# Patient Record
Sex: Male | Born: 1995 | Hispanic: No | Marital: Single | Smoking: Never smoker
Health system: Southern US, Community
[De-identification: ages and names within clinical notes are randomized; demographics above are authoritative.]

## PROBLEM LIST (undated history)

## (undated) DIAGNOSIS — J45909 Unspecified asthma, uncomplicated: Secondary | ICD-10-CM

## (undated) DIAGNOSIS — R109 Unspecified abdominal pain: Secondary | ICD-10-CM

## (undated) HISTORY — DX: Unspecified abdominal pain: R10.9

## (undated) HISTORY — PX: TYMPANOSTOMY TUBE PLACEMENT: SHX32

---
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
## 2017-06-16 DIAGNOSIS — J020 Streptococcal pharyngitis: Secondary | ICD-10-CM | POA: Diagnosis not present

## 2018-04-12 ENCOUNTER — Other Ambulatory Visit: Payer: Self-pay

## 2018-04-12 ENCOUNTER — Encounter: Payer: Self-pay | Admitting: Emergency Medicine

## 2018-04-12 ENCOUNTER — Emergency Department
Admission: EM | Admit: 2018-04-12 | Discharge: 2018-04-12 | Disposition: A | Discharge status: Home / Self Care | Payer: Self-pay | Attending: Emergency Medicine | Admitting: Emergency Medicine

## 2018-04-12 DIAGNOSIS — J45909 Unspecified asthma, uncomplicated: Secondary | ICD-10-CM | POA: Insufficient documentation

## 2018-04-12 DIAGNOSIS — B9789 Other viral agents as the cause of diseases classified elsewhere: Secondary | ICD-10-CM | POA: Insufficient documentation

## 2018-04-12 DIAGNOSIS — J069 Acute upper respiratory infection, unspecified: Secondary | ICD-10-CM | POA: Insufficient documentation

## 2018-04-12 DIAGNOSIS — Z79899 Other long term (current) drug therapy: Secondary | ICD-10-CM | POA: Insufficient documentation

## 2018-04-12 NOTE — Discharge Instructions (Addendum)
Please continue with over-the-counter Tylenol cough and cold medication.  You may also take ibuprofen.  Make sure you are drinking lots of fluids.  If any fevers, worsening cough shortness of breath return to the ER.

## 2018-04-12 NOTE — ED Notes (Signed)
Pt c/o sweats, cough, sore throat. Denies HA, N/V/D. Pt has not checked to see if he has fever. Sore chest d/t cough. Family at bedside.

## 2018-04-12 NOTE — ED Triage Notes (Signed)
Pt to ED via POV c/o cough x 2 days. Pt states that he is able to get up some clear mucus. Pt denies fevers or chills but states that he is having episodes of sweating. Pt denies body aches. Pt is in NAD at this time.

## 2018-04-12 NOTE — ED Provider Notes (Signed)
[facility]   CSN: [id number] Arrival date & time: 04/12/18  1738     History   Chief Complaint Chief Complaint  Patient presents with  . Cough    HPI Kent Torres is a 22 y.o. male presents to the emergency department for evaluation of cough, chills, sore throat.  Symptoms been present for 2 days.  He denies any fevers chest pain or shortness of breath.  No nausea vomiting or diarrhea.  No headaches.  He has not taken any medications except for Tylenol cough and cold with only mild relief of his cough and nasal congestion.  HPI  Past Medical History:  Diagnosis Date  . Abdominal pain, recurrent   . Asthma     Patient Active Problem List   Diagnosis Date Noted  . GE reflux 06/26/2011  . Nausea 06/12/2011  . Obesity (BMI 30-39.9) 06/12/2011  . Periumbilical abdominal pain     Past Surgical History:  Procedure Laterality Date  . TYMPANOSTOMY TUBE PLACEMENT          Home Medications    Prior to Admission medications   Medication Sig Start Date End Date Taking? Authorizing Provider  albuterol-ipratropium (COMBIVENT) 18-103 MCG/ACT inhaler Inhale 2 puffs into the lungs every 6 (six) hours as needed.    [provider]  cetirizine (ZYRTEC) 10 MG tablet Take 10 mg by mouth daily.    [provider]  levalbuterol Pauline Aus(XOPENEX HFA) 45 MCG/ACT inhaler Inhale 1-2 puffs into the lungs every 4 (four) hours as needed.    [provider]  naproxen (NAPROSYN) 500 MG tablet Take 1 tablet (500 mg total) by mouth 2 (two) times daily. 04/19/15   Jolene ProvostPatel, Kirtida, MD    Family History Family History  Problem Relation Age of Onset  . Ulcers Father   . Cholelithiasis Maternal Grandmother   . Cholelithiasis Paternal Grandmother     Social History Social History   Tobacco Use  . Smoking status: Never Smoker  . Smokeless tobacco: Never Used  Substance Use Topics  . Alcohol use: No  . Drug use: No       Allergies   Patient has no known allergies.   Review of Systems Review of Systems  Constitutional: Positive for chills. Negative for fever.  HENT: Positive for congestion, rhinorrhea and sore throat. Negative for sinus pressure and sinus pain.   Respiratory: Positive for cough. Negative for wheezing and stridor.   Gastrointestinal: Negative for diarrhea, nausea and vomiting.  Musculoskeletal: Positive for myalgias. Negative for arthralgias and neck stiffness.  Skin: Negative for rash.  Neurological: Negative for dizziness.     Physical Exam Updated Vital Signs BP 132/80   Pulse 83   Temp 98.8 F (37.1 C) (Oral)   Resp 16   Ht 6\' 1"  (1.854 m)   Wt 132.9 kg   SpO2 99%   BMI 38.66 kg/m   Physical Exam Constitutional:      General: He is not in acute distress.    Appearance: He is well-developed.  HENT:     Head: Normocephalic and atraumatic.     Jaw: No trismus.     Right Ear: Hearing, tympanic membrane, ear canal and external ear normal.     Left Ear: Hearing, tympanic membrane, ear canal and external ear normal.     Nose: Rhinorrhea present.     Right Sinus: No maxillary sinus tenderness or frontal sinus tenderness.     Left Sinus: No maxillary sinus  tenderness or frontal sinus tenderness.     Mouth/Throat:     Pharynx: No oropharyngeal exudate, posterior oropharyngeal erythema or uvula swelling.     Tonsils: No tonsillar abscesses.  Eyes:     Conjunctiva/sclera: Conjunctivae normal.  Neck:     Musculoskeletal: Normal range of motion.  Cardiovascular:     Rate and Rhythm: Normal rate and regular rhythm.  Pulmonary:     Effort: Pulmonary effort is normal. No respiratory distress.     Breath sounds: Normal breath sounds. No stridor. No wheezing.  Chest:     Chest wall: No tenderness.  Abdominal:     General: There is no distension.     Palpations: Abdomen is soft.     Tenderness: There is no abdominal tenderness. There is no guarding.  Musculoskeletal:  Normal range of motion.  Skin:    General: Skin is warm and dry.     Findings: No rash.  Neurological:     Mental Status: He is alert and oriented to person, place, and time.  Psychiatric:        Behavior: Behavior normal.        Thought Content: Thought content normal.        Judgment: Judgment normal.      ED Treatments / Results  Labs (all labs ordered are listed, but only abnormal results are displayed) Labs Reviewed - No data to display  EKG None  Radiology No results found.  Procedures Procedures (including critical care time)  Medications Ordered in ED Medications - No data to display   Initial Impression / Assessment and Plan / ED Course  I have reviewed the triage vital signs and the nursing notes.  Pertinent labs & imaging results that were available during my care of the patient were reviewed by me and considered in my medical decision making (see chart for details).     22 year old male with upper respiratory infection.  Vital signs are stable.  Lung sounds are normal.  No concern for strep.  Patient will continue with symptomatic relief with over-the-counter medications he understands signs symptoms return to the ED for.   Final Clinical Impressions(s) / ED Diagnoses   Final diagnoses:  Viral URI with cough    ED Discharge Orders    None       Ronnette JuniperGaines, Thomas C, PA-C 04/12/18 Vincente [id number]    Goodman, Graydon, MD 04/12/18 [phone]

## 2018-09-11 ENCOUNTER — Encounter: Payer: Self-pay | Admitting: Emergency Medicine

## 2018-09-11 ENCOUNTER — Other Ambulatory Visit: Payer: Self-pay

## 2018-09-11 ENCOUNTER — Emergency Department: Payer: HRSA Program

## 2018-09-11 ENCOUNTER — Emergency Department
Admission: EM | Admit: 2018-09-11 | Discharge: 2018-09-11 | Disposition: A | Discharge status: Home / Self Care | Payer: HRSA Program | Attending: Emergency Medicine | Admitting: Emergency Medicine

## 2018-09-11 DIAGNOSIS — J45909 Unspecified asthma, uncomplicated: Secondary | ICD-10-CM | POA: Diagnosis not present

## 2018-09-11 DIAGNOSIS — U071 COVID-19: Secondary | ICD-10-CM | POA: Diagnosis not present

## 2018-09-11 DIAGNOSIS — R509 Fever, unspecified: Secondary | ICD-10-CM | POA: Diagnosis present

## 2018-09-11 DIAGNOSIS — Z79899 Other long term (current) drug therapy: Secondary | ICD-10-CM | POA: Insufficient documentation

## 2018-09-11 LAB — COMPREHENSIVE METABOLIC PANEL
ALT: 21 U/L (ref 0–44)
AST: 18 U/L (ref 15–41)
Albumin: 4.2 g/dL (ref 3.5–5.0)
Alkaline Phosphatase: 42 U/L (ref 38–126)
Anion gap: 11 (ref 5–15)
BUN: 11 mg/dL (ref 6–20)
CO2: 21 mmol/L — ABNORMAL LOW (ref 22–32)
Calcium: 8.9 mg/dL (ref 8.9–10.3)
Chloride: 106 mmol/L (ref 98–111)
Creatinine, Ser: 1.13 mg/dL (ref 0.61–1.24)
GFR calc Af Amer: >60 mL/min (ref >60)
GFR calc non Af Amer: >60 mL/min (ref >60)
Glucose, Bld: 111 mg/dL — ABNORMAL HIGH (ref 70–99)
Potassium: 3.3 mmol/L — ABNORMAL LOW (ref 3.5–5.1)
Sodium: 138 mmol/L (ref 135–145)
Total Bilirubin: 0.6 mg/dL (ref 0.3–1.2)
Total Protein: 7.6 g/dL (ref 6.5–8.1)

## 2018-09-11 LAB — CBC WITH DIFFERENTIAL/PLATELET
Abs Immature Granulocytes: 0.02 10*3/uL (ref 0.00–0.07)
Basophils Absolute: 0 10*3/uL (ref 0.0–0.1)
Basophils Relative: 0 %
Eosinophils Absolute: 0 10*3/uL (ref 0.0–0.5)
Eosinophils Relative: 1 %
HCT: 41.3 % (ref 39.0–52.0)
Hemoglobin: 13.7 g/dL (ref 13.0–17.0)
Immature Granulocytes: 0 %
Lymphocytes Relative: 12 %
Lymphs Abs: 0.6 10*3/uL — ABNORMAL LOW (ref 0.7–4.0)
MCH: 27.4 pg (ref 26.0–34.0)
MCHC: 33.2 g/dL (ref 30.0–36.0)
MCV: 82.6 fL (ref 80.0–100.0)
Monocytes Absolute: 0.8 10*3/uL (ref 0.1–1.0)
Monocytes Relative: 15 %
Neutro Abs: 3.9 10*3/uL (ref 1.7–7.7)
Neutrophils Relative %: 72 %
Platelets: 198 10*3/uL (ref 150–400)
RBC: 5 MIL/uL (ref 4.22–5.81)
RDW: 13 % (ref 11.5–15.5)
WBC: 5.5 10*3/uL (ref 4.0–10.5)
nRBC: 0 % (ref 0.0–0.2)

## 2018-09-11 LAB — SARS CORONAVIRUS 2 BY RT PCR (HOSPITAL ORDER, PERFORMED IN ~~LOC~~ HOSPITAL LAB): SARS Coronavirus 2: POSITIVE — AB

## 2018-09-11 LAB — TROPONIN I: Troponin I: <0.03 ng/mL (ref <0.03)

## 2018-09-11 MED ORDER — KETOROLAC TROMETHAMINE 30 MG/ML IJ SOLN
15.0000 mg | Freq: Once | INTRAMUSCULAR | Status: AC
  Administered 2018-09-11: 15 mg via INTRAVENOUS
  Filled 2018-09-11: qty 1

## 2018-09-11 MED ORDER — ACETAMINOPHEN 500 MG PO TABS
1000.0000 mg | ORAL_TABLET | Freq: Once | ORAL | Status: AC
  Administered 2018-09-11: 12:00:00 1000 mg via ORAL
  Filled 2018-09-11: qty 2

## 2018-09-11 MED ORDER — SODIUM CHLORIDE 0.9 % IV BOLUS
1000.0000 mL | Freq: Once | INTRAVENOUS | Status: AC
Start: 2018-09-11 — End: 2018-09-11
  Administered 2018-09-11: 12:00:00 1000 mL via INTRAVENOUS

## 2018-09-11 NOTE — ED Triage Notes (Signed)
Patient presents to the ED with fever, body aches and chills.  Patient is ambulatory to the ED.  No obvious distress at this time.

## 2018-09-11 NOTE — ED Notes (Signed)
Pt ambulatory in room, no SHOB reported, O2 sats remain 98-99%

## 2018-09-11 NOTE — ED Provider Notes (Signed)
[facility] Emergency Department Provider Note  ____________________________________________  Time seen: Approximately 1:17 PM  I have reviewed the triage vital signs and the nursing notes.   HISTORY  Chief Complaint Fever   HPI Kent Torres is a 23 y.o. male no significant past medical history who presents for evaluation of fever and body aches.  Patient reports that his symptoms started yesterday with fever, body aches, chills, and a mild cough.  He felt better at bedtime but this morning symptoms recurred.  No known exposures to COVID.  Patient does work in a factory.  He denies sore throat, vomiting, diarrhea, chest pain, shortness of breath, abdominal pain.  His symptoms are constant and moderate in intensity.  Past Medical History:  Diagnosis Date   Abdominal pain, recurrent    Asthma     Patient Active Problem List   Diagnosis Date Noted   GE reflux 06/26/2011   Nausea 06/12/2011   Obesity (BMI 30-39.9) 06/12/2011   Periumbilical abdominal pain     Past Surgical History:  Procedure Laterality Date   TYMPANOSTOMY TUBE PLACEMENT      Prior to Admission medications   Medication Sig Start Date End Date Taking? Authorizing Provider  albuterol-ipratropium (COMBIVENT) 18-103 MCG/ACT inhaler Inhale 2 puffs into the lungs every 6 (six) hours as needed.    [provider]  cetirizine (ZYRTEC) 10 MG tablet Take 10 mg by mouth daily.    [provider]  levalbuterol Pauline Aus(XOPENEX HFA) 45 MCG/ACT inhaler Inhale 1-2 puffs into the lungs every 4 (four) hours as needed.    [provider]  naproxen (NAPROSYN) 500 MG tablet Take 1 tablet (500 mg total) by mouth 2 (two) times daily. 04/19/15   Jolene ProvostPatel, Kirtida, MD    Allergies Patient has no known allergies.  Family History  Problem Relation Age of Onset   Ulcers Father    Cholelithiasis Maternal Grandmother    Cholelithiasis Paternal Grandmother     Social  History Social History   Tobacco Use   Smoking status: Never Smoker   Smokeless tobacco: Never Used  Substance Use Topics   Alcohol use: No   Drug use: No    Review of Systems  Constitutional: + fever, chills, body aches Eyes: Negative for visual changes. ENT: Negative for sore throat. Neck: No neck pain  Cardiovascular: Negative for chest pain. Respiratory: Negative for shortness of breath. + cough Gastrointestinal: Negative for abdominal pain, vomiting or diarrhea. Genitourinary: Negative for dysuria. Musculoskeletal: Negative for back pain. Skin: Negative for rash. Neurological: Negative for headaches, weakness or numbness. Psych: No SI or HI  ____________________________________________   PHYSICAL EXAM:  VITAL SIGNS: ED Triage Vitals [09/11/18 1109]  Enc Vitals Group     BP 132/71     Pulse Rate (!) 112     Resp      Temp (!) 103 F (39.4 C)     Temp Source Oral     SpO2 94 %     Weight 294 lb (133.4 kg)     Height 6\' 1"  (1.854 m)     Head Circumference      Peak Flow      Pain Score 0     Pain Loc      Pain Edu?      Excl. in GC?     Constitutional: Alert and oriented. Well appearing and in no apparent distress. HEENT:      Head: Normocephalic and atraumatic.  Eyes: Conjunctivae are normal. Sclera is non-icteric.       Mouth/Throat: Mucous membranes are moist.       Neck: Supple with no signs of meningismus. Cardiovascular: Tachycardic with regular rhythm. No murmurs, gallops, or rubs. 2+ symmetrical distal pulses are present in all extremities. No JVD. Respiratory: Normal respiratory effort. Lungs are clear to auscultation bilaterally. No wheezes, crackles, or rhonchi.  Gastrointestinal: Soft, non tender, and non distended with positive bowel sounds. No rebound or guarding. Musculoskeletal: Nontender with normal range of motion in all extremities. No edema, cyanosis, or erythema of extremities. Neurologic: Normal speech and language. Face  is symmetric. Moving all extremities. No gross focal neurologic deficits are appreciated. Skin: Skin is warm, dry and intact. No rash noted. Psychiatric: Mood and affect are normal. Speech and behavior are normal.  ____________________________________________   LABS (all labs ordered are listed, but only abnormal results are displayed)  Labs Reviewed  SARS CORONAVIRUS 2 (HOSPITAL ORDER, PERFORMED IN [facility]) - Abnormal; Notable for the following components:      Result Value   SARS Coronavirus 2 POSITIVE (*)    All other components within normal limits  CBC WITH DIFFERENTIAL/PLATELET - Abnormal; Notable for the following components:   Lymphs Abs 0.6 (*)    All other components within normal limits  COMPREHENSIVE METABOLIC PANEL - Abnormal; Notable for the following components:   Potassium 3.3 (*)    CO2 21 (*)    Glucose, Bld 111 (*)    All other components within normal limits  TROPONIN I   ____________________________________________  EKG  ED ECG REPORT I, Nita Sickle, the attending physician, personally viewed and interpreted this ECG.  Sinus tachycardia, rate of 115, normal intervals, normal axis, no ST elevations or depressions, diffuse T wave flattening.  No prior for comparison. ____________________________________________  RADIOLOGY  I have personally reviewed the images performed during this visit and I agree with the Radiologist's read.   Interpretation by Radiologist:  Dg Chest Portable 1 View  Result Date: 09/11/2018 CLINICAL DATA:  Fever, shortness of breath EXAM: PORTABLE CHEST 1 VIEW COMPARISON:  04/19/2015 FINDINGS: The heart size and mediastinal contours are within normal limits. Both lungs are clear. The visualized skeletal structures are unremarkable. IMPRESSION: No active disease. Electronically Signed   By: Elige Ko   On: 09/11/2018 11:28     ____________________________________________   PROCEDURES  Procedure(s)  performed: None Procedures Critical Care performed:  None ____________________________________________   INITIAL IMPRESSION / ASSESSMENT AND PLAN / ED COURSE  23 y.o. male no significant past medical history who presents for evaluation of fever, body aches, cough, chills since last night. Ddx COVID, PNA, viral illness.  Patient is febrile of 103F and tachycardic to 112.  Normal work of breathing, normal sats, lungs are clear to auscultation.  Chest x-ray negative for pneumonia.  Labs showing normal white count.  COVID swab is positive. EKG and troponin with no evidence of cardiac injury.  Patient has normal work of breathing, normal sats, clear lungs otherwise, normal sats with ambulation.  No indication for admission at this time.  Discussed very strict follow-up and return precautions.  Recommended Tylenol for fever.  Recommended buying a pulse oximeter and checking at least 3 times at home.  Discussed return precautions for chest pain, shortness of breath or hypoxia.  Discussed quarantine with patient.      As part of my medical decision making, I reviewed the following data within the electronic MEDICAL RECORD NUMBER Nursing  notes reviewed and incorporated, Labs reviewed , EKG interpreted , Old EKG reviewed, Old chart reviewed, Radiograph reviewed , Notes from prior ED visits and  Controlled Substance Database    Pertinent labs & imaging results that were available during my care of the patient were reviewed by me and considered in my medical decision making (see chart for details).    ____________________________________________   FINAL CLINICAL IMPRESSION(S) / ED DIAGNOSES  Final diagnoses:  COVID-19 virus infection      NEW MEDICATIONS STARTED DURING THIS VISIT:  ED Discharge Orders    None       Note:  This document was prepared using Dragon voice recognition software and may include unintentional dictation errors.    Don Perking, [place], MD 09/11/18 [phone]

## 2018-11-04 DIAGNOSIS — S90821A Blister (nonthermal), right foot, initial encounter: Secondary | ICD-10-CM | POA: Diagnosis not present

## 2018-12-08 ENCOUNTER — Ambulatory Visit: Payer: Self-pay | Admitting: Physician Assistant

## 2019-04-22 DIAGNOSIS — J209 Acute bronchitis, unspecified: Secondary | ICD-10-CM | POA: Diagnosis not present

## 2020-02-26 DIAGNOSIS — Z20822 Contact with and (suspected) exposure to covid-19: Secondary | ICD-10-CM | POA: Diagnosis not present

## 2020-02-26 DIAGNOSIS — J069 Acute upper respiratory infection, unspecified: Secondary | ICD-10-CM | POA: Diagnosis not present

## 2020-02-26 DIAGNOSIS — J209 Acute bronchitis, unspecified: Secondary | ICD-10-CM | POA: Diagnosis not present

## 2021-12-10 ENCOUNTER — Other Ambulatory Visit: Payer: Self-pay

## 2021-12-10 ENCOUNTER — Emergency Department
Admission: EM | Admit: 2021-12-10 | Discharge: 2021-12-11 | Disposition: A | Discharge status: Home / Self Care | Payer: Medicaid Other | Attending: Emergency Medicine | Admitting: Emergency Medicine

## 2021-12-10 ENCOUNTER — Emergency Department: Payer: Medicaid Other

## 2021-12-10 DIAGNOSIS — W458XXA Other foreign body or object entering through skin, initial encounter: Secondary | ICD-10-CM | POA: Diagnosis not present

## 2021-12-10 DIAGNOSIS — S60454A Superficial foreign body of right ring finger, initial encounter: Secondary | ICD-10-CM | POA: Insufficient documentation

## 2021-12-10 DIAGNOSIS — M795 Residual foreign body in soft tissue: Secondary | ICD-10-CM

## 2021-12-10 MED ORDER — LIDOCAINE HCL (PF) 1 % IJ SOLN
10.0000 mL | Freq: Once | INTRAMUSCULAR | Status: AC
  Administered 2021-12-10: 10 mL
  Filled 2021-12-10: qty 10

## 2021-12-10 NOTE — ED Triage Notes (Signed)
Patient ambulatory to triage with steady gait, without difficulty or distress noted; pt with tail end of comb punturing thru right 4th & 5th digit

## 2021-12-11 MED ORDER — CEPHALEXIN 500 MG PO CAPS: 500.0000 mg | ORAL_CAPSULE | Freq: Three times a day (TID) | ORAL | 0 refills | Status: AC

## 2021-12-11 MED ORDER — CEPHALEXIN 500 MG PO CAPS
500.0000 mg | ORAL_CAPSULE | Freq: Once | ORAL | Status: AC
  Administered 2021-12-11: 500 mg via ORAL
  Filled 2021-12-11: qty 1

## 2021-12-11 NOTE — ED Provider Notes (Signed)
[facility] Provider Note    Event Date/Time   First MD Initiated Contact with Patient 12/10/21 2341     (approximate)   History   Foreign Body   HPI  Kent Torres is a 26 y.o. male who presents to the ED for evaluation of Foreign Body   Patient presents to the ED after accidentally stabbing himself with the metal tine of a hair comb.  Reports he was taken the braids out of his hair when "I do not know what happened."  Patient is right-hand dominant.  Physical Exam   Triage Vital Signs: ED Triage Vitals  Enc Vitals Group     BP 12/10/21 2327 (!) 156/100     Pulse Rate 12/10/21 2327 87     Resp 12/10/21 2327 18     Temp 12/10/21 2327 98 F (36.7 C)     Temp Source 12/10/21 2327 Oral     SpO2 12/10/21 2327 100 %     Weight 12/10/21 2324 (!) 310 lb (140.6 kg)     Height 12/10/21 2324 6\' 1"  (1.854 m)     Head Circumference --      Peak Flow --      Pain Score 12/10/21 2327 10     Pain Loc --      Pain Edu? --      Excl. in GC? --     Most recent vital signs: Vitals:   12/10/21 2327 12/11/21 0021  BP: (!) 156/100 (!) 149/73  Pulse: 87 79  Resp: 18 19  Temp: 98 F (36.7 C) 98.3 F (36.8 C)  SpO2: 100% 98%    General: Awake, no distress.  Looks systemically well.  Has a metal foreign body projecting through the palmar aspects of his right fourth and fifth fingers as pictured below.  Extends from the middle phalanx of the fifth digit through the DIP of the fourth digit.  Seems to be anterior/palmar to the bone.  No bleeding. CV:  Good peripheral perfusion.  Resp:  Normal effort.  Abd:  No distention.  MSK:  No deformity noted.  Neuro:  No focal deficits appreciated. Other:        ED Results / Procedures / Treatments   Labs (all labs ordered are listed, but only abnormal results are displayed) Labs Reviewed - No data to display  EKG   RADIOLOGY Plain film of the right hand interpreted by me with metallic foreign body through  the fourth and fifth digits without clear signs of bony pathology or fracture  Official radiology report(s): DG Hand Complete Right  Result Date: 12/10/2021 CLINICAL DATA:  Foreign body in the fourth digit related to a comb, initial encounter EXAM: RIGHT HAND - COMPLETE 3+ VIEW COMPARISON:  None Available. FINDINGS: Linear metallic foreign body is noted extending into the soft tissues of the distal fourth digit near the DIP joint. No acute fracture is noted. No bony involvement is identified. No other abnormality is seen. IMPRESSION: Metallic foreign body in the fourth digit consistent with the given clinical history. No bony abnormality is noted. Electronically Signed   By: 12/12/2021 M.D.   On: 12/10/2021 23:44    PROCEDURES and INTERVENTIONS:  .Nerve Block  Date/Time: 12/11/2021 12:08 AM  Performed by: 12/13/2021, MD Authorized by: Delton Prairie, MD   Consent:    Consent obtained:  Verbal   Consent given by:  Patient   Risks, benefits, and alternatives were discussed: yes   Indications:  Indications:  Procedural anesthesia Location:    Body area:  Upper extremity   Laterality:  Right Pre-procedure details:    Skin preparation:  Povidone-iodine   Preparation: Patient was prepped and draped in usual sterile fashion   Skin anesthesia:    Skin anesthesia method:  Local infiltration   Local anesthetic:  Lidocaine 1% w/o epi Post-procedure details:    Dressing:  None   Outcome:  Anesthesia achieved   Procedure completion:  Tolerated well, no immediate complications Comments:     Digital block to the right fourth and fifth fingers performed by landmarks .Foreign Body Removal  Date/Time: 12/11/2021 12:35 AM  Performed by: Delton Prairie, MD Authorized by: Delton Prairie, MD  Risks and benefits: risks, benefits and alternatives were discussed Consent given by: patient Intake: Finger. Anesthesia: digital block Patient restrained: no Patient cooperative: yes Complexity:  simple 1 objects recovered. Objects recovered: Metal Tine from a comb Post-procedure assessment: foreign body removed Patient tolerance: patient tolerated the procedure well with no immediate complications Comments: Well-tolerated, able to pull out metal tine from a comb and 1 smooth motion.  No broken pieces or disruptions noted of this object and I see no indication to repeat the plain film    Medications  lidocaine (PF) (XYLOCAINE) 1 % injection 10 mL (10 mLs Infiltration Given 12/10/21 2359)  cephALEXin (KEFLEX) capsule 500 mg (500 mg Oral Given 12/11/21 0019)     IMPRESSION / MDM / ASSESSMENT AND PLAN / ED COURSE  I reviewed the triage vital signs and the nursing notes.  Differential diagnosis includes, but is not limited to, foreign body, fracture, arterial hemorrhage, tendinous disruption.  Healthy 25yo presents the ED with metal tine from a comb through his fingers.  After digital block, able to easily remove.  Doubt residual foreign body.  Started on Keflex to prevent infection.  Full range of motion after this and able to make a full fist without signs of tendon disruption.  No arterial bleeding.  Hemostatic with direct pressure.  Suitable for outpatient management.  We discussed return precautions.  Clinical Course as of 12/11/21 0036  Tue Dec 11, 2021  0005 Digital block of right fourth and fifth fingers performed.  Well-tolerated. [DS]  0015 FB removed and well tolerated [DS]    Clinical Course User Index [DS] Delton Prairie, MD     FINAL CLINICAL IMPRESSION(S) / ED DIAGNOSES   Final diagnoses:  Foreign body (FB) in soft tissue     Rx / DC Orders   ED Discharge Orders          Ordered    cephALEXin (KEFLEX) 500 MG capsule  3 times daily        12/11/21 0015             Note:  This document was prepared using Dragon voice recognition software and may include unintentional dictation errors.   Delton Prairie, MD 12/11/21 [phone]

## 2021-12-11 NOTE — ED Notes (Signed)
Pt Dc to home. Dc instructions reviewed with all questions answered. Pt verbalizes understanding. Pt ambulatory out of dept with steady gait 

## 2021-12-11 NOTE — Discharge Instructions (Signed)
Please take Tylenol and ibuprofen/Advil for your pain.  It is safe to take them together, or to alternate them every few hours.  Take up to 1000mg  of Tylenol at a time, up to 4 times per day.  Do not take more than 4000 mg of Tylenol in 24 hours.  For ibuprofen, take 400-600 mg, 3 - 4 times per day.  Take the Keflex antibiotic 3 times daily for the next 5 days to help prevent infection.  If you see any signs of infection, swelling, redness and worsening/severe pain that is spreading then please return to the ED.

## 2022-06-08 ENCOUNTER — Emergency Department: Payer: Medicaid Other

## 2022-06-08 ENCOUNTER — Emergency Department
Admission: EM | Admit: 2022-06-08 | Discharge: 2022-06-08 | Disposition: A | Discharge status: Home / Self Care | Payer: Medicaid Other | Attending: Emergency Medicine | Admitting: Emergency Medicine

## 2022-06-08 ENCOUNTER — Other Ambulatory Visit: Payer: Self-pay

## 2022-06-08 DIAGNOSIS — R531 Weakness: Secondary | ICD-10-CM | POA: Diagnosis not present

## 2022-06-08 DIAGNOSIS — R1013 Epigastric pain: Secondary | ICD-10-CM | POA: Diagnosis not present

## 2022-06-08 DIAGNOSIS — R252 Cramp and spasm: Secondary | ICD-10-CM | POA: Insufficient documentation

## 2022-06-08 DIAGNOSIS — R079 Chest pain, unspecified: Secondary | ICD-10-CM | POA: Diagnosis not present

## 2022-06-08 DIAGNOSIS — R0789 Other chest pain: Secondary | ICD-10-CM | POA: Diagnosis not present

## 2022-06-08 LAB — HEPATIC FUNCTION PANEL
ALT: 23 U/L (ref 0–44)
AST: 28 U/L (ref 15–41)
Albumin: 4.2 g/dL (ref 3.5–5.0)
Alkaline Phosphatase: 37 U/L — ABNORMAL LOW (ref 38–126)
Bilirubin, Direct: 0.3 mg/dL — ABNORMAL HIGH (ref 0.0–0.2)
Indirect Bilirubin: 0.7 mg/dL (ref 0.3–0.9)
Total Bilirubin: 1 mg/dL (ref 0.3–1.2)
Total Protein: 7.2 g/dL (ref 6.5–8.1)

## 2022-06-08 LAB — BASIC METABOLIC PANEL
Anion gap: 6 (ref 5–15)
BUN: 17 mg/dL (ref 6–20)
CO2: 21 mmol/L — ABNORMAL LOW (ref 22–32)
Calcium: 8.9 mg/dL (ref 8.9–10.3)
Chloride: 110 mmol/L (ref 98–111)
Creatinine, Ser: 1.02 mg/dL (ref 0.61–1.24)
GFR, Estimated: >60 mL/min (ref >60)
Glucose, Bld: 122 mg/dL — ABNORMAL HIGH (ref 70–99)
Potassium: 4.1 mmol/L (ref 3.5–5.1)
Sodium: 137 mmol/L (ref 135–145)

## 2022-06-08 LAB — CK: Total CK: 367 U/L (ref 49–397)

## 2022-06-08 LAB — CBC
HCT: 42.5 % (ref 39.0–52.0)
Hemoglobin: 14.2 g/dL (ref 13.0–17.0)
MCH: 27.7 pg (ref 26.0–34.0)
MCHC: 33.4 g/dL (ref 30.0–36.0)
MCV: 82.8 fL (ref 80.0–100.0)
Platelets: 270 10*3/uL (ref 150–400)
RBC: 5.13 MIL/uL (ref 4.22–5.81)
RDW: 12.8 % (ref 11.5–15.5)
WBC: 9.3 10*3/uL (ref 4.0–10.5)
nRBC: 0 % (ref 0.0–0.2)

## 2022-06-08 LAB — D-DIMER, QUANTITATIVE: D-Dimer, Quant: <0.27 ug/mL-FEU (ref 0.00–0.50)

## 2022-06-08 LAB — LIPASE, BLOOD: Lipase: 33 U/L (ref 11–51)

## 2022-06-08 LAB — MAGNESIUM: Magnesium: 2.5 mg/dL — ABNORMAL HIGH (ref 1.7–2.4)

## 2022-06-08 LAB — TROPONIN I (HIGH SENSITIVITY): Troponin I (High Sensitivity): <2 ng/L (ref <18)

## 2022-06-08 MED ORDER — FAMOTIDINE 20 MG PO TABS
20.0000 mg | ORAL_TABLET | Freq: Once | ORAL | Status: AC
  Administered 2022-06-08: 20 mg via ORAL
  Filled 2022-06-08: qty 1

## 2022-06-08 MED ORDER — FAMOTIDINE 20 MG PO TABS: 20.0000 mg | ORAL_TABLET | Freq: Every day | ORAL | 11 refills | Status: DC

## 2022-06-08 MED ORDER — ALUM & MAG HYDROXIDE-SIMETH 200-200-20 MG/5ML PO SUSP
30.0000 mL | Freq: Once | ORAL | Status: AC
  Administered 2022-06-08: 30 mL via ORAL
  Filled 2022-06-08: qty 30

## 2022-06-08 MED ORDER — FAMOTIDINE 20 MG PO TABS: 20.0000 mg | ORAL_TABLET | Freq: Every day | ORAL | 11 refills | Status: AC

## 2022-06-08 MED ORDER — LIDOCAINE VISCOUS HCL 2 % MT SOLN
15.0000 mL | Freq: Once | OROMUCOSAL | Status: AC
  Administered 2022-06-08: 15 mL via ORAL
  Filled 2022-06-08: qty 15

## 2022-06-08 NOTE — Discharge Instructions (Signed)
Stay well-hydrated by drinking plenty of fluids.  Take Pepcid as prescribed.  Thank you for choosing [place] for your health care today!  Please see your primary doctor this week for a follow up appointment.   Sometimes, in the early stages of certain disease courses it is difficult to detect in the emergency department evaluation -- so, it is important that you continue to monitor your symptoms and call your doctor right away or return to the emergency department if you develop any new or worsening symptoms.  Please go to the following website to schedule new (and existing) patient appointments:   [email or web address]  If you do not have a primary doctor try calling the following clinics to establish care:  If you have insurance:  [facility] [phone] [place] [address]   [facility]  [phone] [place] [place] [address]   If you do not have insurance:  [facility]  [phone] [address] [place] [address]   The following is another list of primary care offices in the area who are accepting new patients at this time.  Please reach out to one of them directly and let them know you would like to schedule an appointment to follow up on an Emergency Department visit, and/or to establish a new primary care provider (PCP).  There are likely other primary care clinics in the are who are accepting new patients, but this is an excellent place to start:  [place] physician: Dr Lavon Paganini [address] [place], [place] [address] [phone]  [facility] Lead Physician: Dr Steele Sizer [address], [place], [place] [address] [phone]  [place] Physician: Dr Park Liter [address] [place], [place] [address] [phone]  [facility] Lead Physician: Dr Dewaine Oats [place], [place], [place] [address] [phone]  [facility] Physician: Dr Halina Maidens [address] [place],  [address] [phone]   It was my pleasure to care for you today.   Hoover Brunette Jacelyn Grip, MD

## 2022-06-08 NOTE — ED Triage Notes (Signed)
Pt to ED via POV c/o weakness, leg cramps, and chest pain. Pt reports feeling weak and faint since yesterday. Leg cramps started yesterday as well. Chest pain started an hr ago, sharp pains that come and go from midchest to stomach. Pt denies and fevers, coughs, SOB.

## 2022-06-08 NOTE — ED Notes (Signed)
Report received, this RN now assuming care.

## 2022-06-08 NOTE — ED Provider Notes (Signed)
[facility] Provider Note    Event Date/Time   First MD Initiated Contact with Patient 06/08/22 2202     (approximate)   History   Chest Pain and Weakness   HPI  Kent Torres is a 27 y.o. male   Past medical history of no significant past medical history presents the emergency department with leg cramps to bilateral calves that started yesterday and then epigastric/chest pain today.  Started after eating dinner.  He works as a Scientist, research (physical sciences).  He has no significant family history of cardiac disease, sudden death, clotting disorders or blood clot.  He had no obvious exacerbating factors or alleviating factors.   He has no shortness of breath.  No other acute medical complaints.  Independent Historian contributed to assessment above: Patient's mother at bedside       Physical Exam   Triage Vital Signs: ED Triage Vitals [06/08/22 2137]  Enc Vitals Group     BP 138/82     Pulse Rate (!) 114     Resp 20     Temp 98.7 F (37.1 C)     Temp Source Oral     SpO2 97 %     Weight (!) 320 lb (145.2 kg)     Height '6\' 1"'$  (1.854 m)     Head Circumference      Peak Flow      Pain Score 6     Pain Loc      Pain Edu?      Excl. in [place]?     Most recent vital signs: Vitals:   06/08/22 2230 06/08/22 2300  BP: 124/83 124/81  Pulse: 95 91  Resp: 18 17  Temp:    SpO2: 98% 98%    General: Awake, no distress.  CV:  Good peripheral perfusion.  Resp:  Normal effort.  Abd:  No distention.  Other:  Initially presented with some mild tachycardia but resolved, vital signs within normal limits comfortable appearing lungs clear abdomen soft nontender no heart murmurs regular rate and rhythm, skin appears warm well-perfused bilateral pulses in the radius equal.  No significant swelling to the lower extremities mild tenderness to palpation of the calves.   ED Results / Procedures / Treatments   Labs (all labs ordered are listed, but only  abnormal results are displayed) Labs Reviewed  BASIC METABOLIC PANEL - Abnormal; Notable for the following components:      Result Value   CO2 21 (*)    Glucose, Bld 122 (*)    All other components within normal limits  HEPATIC FUNCTION PANEL - Abnormal; Notable for the following components:   Alkaline Phosphatase 37 (*)    Bilirubin, Direct 0.3 (*)    All other components within normal limits  MAGNESIUM - Abnormal; Notable for the following components:   Magnesium 2.5 (*)    All other components within normal limits  CBC  LIPASE, BLOOD  CK  D-DIMER, QUANTITATIVE  TROPONIN I (HIGH SENSITIVITY)  TROPONIN I (HIGH SENSITIVITY)     I ordered and reviewed the above labs they are notable for negative initial troponin.  Electrolytes within normal limits magnesium slightly high.  EKG  ED ECG REPORT I, Lucillie Garfinkel, the attending physician, personally viewed and interpreted this ECG.   Date: 06/08/2022  EKG Time: 2143  Rate: 104  Rhythm: sinus tachy  Axis: nl  Intervals: none  ST&T Change: No ischemic changes    RADIOLOGY I independently reviewed and  interpreted chest x-ray see no obvious focality or pneumothorax   PROCEDURES:  Critical Care performed: No  Procedures   MEDICATIONS ORDERED IN ED: Medications  alum & mag hydroxide-simeth (MAALOX/MYLANTA) 200-200-20 MG/5ML suspension 30 mL (30 mLs Oral Given 06/08/22 2237)    And  lidocaine (XYLOCAINE) 2 % viscous mouth solution 15 mL (15 mLs Oral Given 06/08/22 2237)  famotidine (PEPCID) tablet 20 mg (20 mg Oral Given 06/08/22 2237)     IMPRESSION / MDM / ASSESSMENT AND PLAN / ED COURSE  I reviewed the triage vital signs and the nursing notes.                                Patient's presentation is most consistent with acute presentation with potential threat to life or bodily function.  Differential diagnosis includes, but is not limited to, rhabdomyolysis, ACS, blood clot DVT PE, GERD/gastritis musc/ skeletal  pain   The patient is on the cardiac monitor to evaluate for evidence of arrhythmia and/or significant heart rate changes.  MDM: Young man with leg cramping and chest/epigastric pain with a benign exam looks well normal vital signs very low risk factors personally and family history.  Troponin negative x 1, nonischemic EKG, D-dimer is negative, CK unremarkable and electrolytes otherwise unremarkable.    I considered hospitalization for admission or observation but since his symptoms are mild and given negative workup as above and very low risk factors for emergent cardiopulmonary/intra-abdominal pathologies, I think discharge at this time with close outpatient follow-up most appropriate.  He will return with any new or worsening symptoms.        FINAL CLINICAL IMPRESSION(S) / ED DIAGNOSES   Final diagnoses:  Leg cramps  Epigastric pain     Rx / DC Orders   ED Discharge Orders          Ordered    famotidine (PEPCID) 20 MG tablet  Daily,   Status:  Discontinued        06/08/22 2304    famotidine (PEPCID) 20 MG tablet  Daily        06/08/22 2309             Note:  This document was prepared using Dragon voice recognition software and may include unintentional dictation errors.    Lucillie Garfinkel, MD 06/08/22 [phone]

## 2022-08-14 NOTE — Progress Notes (Unsigned)
  No call, No show for new patient appt  Kent Kindle, FNP  [facility] [phone] (phone) [phone] (fax)  [facility]

## 2022-08-15 ENCOUNTER — Ambulatory Visit (INDEPENDENT_AMBULATORY_CARE_PROVIDER_SITE_OTHER): Payer: Medicaid Other | Admitting: Family Medicine

## 2022-08-15 DIAGNOSIS — Z91199 Patient's noncompliance with other medical treatment and regimen due to unspecified reason: Secondary | ICD-10-CM

## 2022-08-24 ENCOUNTER — Other Ambulatory Visit: Payer: Self-pay

## 2022-08-24 DIAGNOSIS — L03211 Cellulitis of face: Secondary | ICD-10-CM | POA: Diagnosis not present

## 2022-08-24 DIAGNOSIS — H1032 Unspecified acute conjunctivitis, left eye: Secondary | ICD-10-CM | POA: Diagnosis not present

## 2022-08-24 DIAGNOSIS — L03213 Periorbital cellulitis: Secondary | ICD-10-CM | POA: Diagnosis not present

## 2022-08-24 DIAGNOSIS — H5712 Ocular pain, left eye: Secondary | ICD-10-CM | POA: Diagnosis present

## 2022-08-24 MED ORDER — FLUORESCEIN SODIUM 1 MG OP STRP: 1.0000 | ORAL_STRIP | Freq: Once | OPHTHALMIC | Status: DC

## 2022-08-24 MED ORDER — TETRACAINE HCL 0.5 % OP SOLN: 2.0000 [drp] | Freq: Once | OPHTHALMIC | Status: DC

## 2022-08-24 NOTE — ED Triage Notes (Addendum)
Pt to ED via POV c/o left eye redness and swelling. Pt states he was fishing earlier today when his eye started to close up. Pt noticed some pus from it. Denies any injury to eye

## 2022-08-25 ENCOUNTER — Emergency Department
Admission: EM | Admit: 2022-08-25 | Discharge: 2022-08-25 | Disposition: A | Discharge status: Home / Self Care | Payer: Medicaid Other | Attending: Emergency Medicine | Admitting: Emergency Medicine

## 2022-08-25 DIAGNOSIS — H1032 Unspecified acute conjunctivitis, left eye: Secondary | ICD-10-CM

## 2022-08-25 DIAGNOSIS — L03211 Cellulitis of face: Secondary | ICD-10-CM

## 2022-08-25 MED ORDER — CEPHALEXIN 500 MG PO CAPS: 500.0000 mg | ORAL_CAPSULE | Freq: Three times a day (TID) | ORAL | 0 refills | Status: AC

## 2022-08-25 MED ORDER — CEPHALEXIN 500 MG PO CAPS
500.0000 mg | ORAL_CAPSULE | Freq: Once | ORAL | Status: AC
  Administered 2022-08-25: 500 mg via ORAL
  Filled 2022-08-25: qty 1

## 2022-08-25 MED ORDER — POLYMYXIN B-TRIMETHOPRIM 10000-0.1 UNIT/ML-% OP SOLN: 2.0000 [drp] | OPHTHALMIC | 0 refills | Status: AC

## 2022-08-25 NOTE — ED Provider Notes (Signed)
[facility] Provider Note    None    (approximate)   History   Eye Problem   HPI  Kent Torres is a 27 y.o. male who presents to the ED for evaluation of Eye Problem   Patient and mother present to the ED for evaluation of left eye redness, pain and swelling.  No trauma, injuries or events.  He was out fishing earlier today and noticed some itchiness that increased rapidly throughout the day with swelling, redness and purulent discharge from his left eye.  No fevers or systemic symptoms.   Physical Exam   Triage Vital Signs: ED Triage Vitals  Enc Vitals Group     BP 08/24/22 2325 137/81     Pulse Rate 08/24/22 2325 64     Resp 08/24/22 2325 18     Temp 08/24/22 2325 98.6 F (37 C)     Temp Source 08/24/22 2325 Oral     SpO2 08/24/22 2325 98 %     Weight 08/24/22 2323 (!) 315 lb (142.9 kg)     Height 08/24/22 2323 6\' 1"  (1.854 m)     Head Circumference --      Peak Flow --      Pain Score 08/24/22 2323 6     Pain Loc --      Pain Edu? --      Excl. in GC? --     Most recent vital signs: Vitals:   08/24/22 2325  BP: 137/81  Pulse: 64  Resp: 18  Temp: 98.6 F (37 C)  SpO2: 98%    General: Awake, no distress.  CV:  Good peripheral perfusion.  Resp:  Normal effort.  Abd:  No distention.  MSK:  No deformity noted.  Neuro:  No focal deficits appreciated. Other:  Some subtle fullness and redness of the soft tissues inferior to the left eye, but no fluctuance.  Small amount of mucopurulence to the medial aspect of the eye.  I have to assist with opening.  Visual acuity intact.  Conjunctival redness is noted.  Pupils are PERRL.     ED Results / Procedures / Treatments   Labs (all labs ordered are listed, but only abnormal results are displayed) Labs Reviewed - No data to display  EKG   RADIOLOGY   Official radiology report(s): No results found.  PROCEDURES and INTERVENTIONS:  Procedures  Medications  tetracaine  (PONTOCAINE) 0.5 % ophthalmic solution 2 drop (has no administration in time range)  fluorescein ophthalmic strip 1 strip (has no administration in time range)     IMPRESSION / MDM / ASSESSMENT AND PLAN / ED COURSE  I reviewed the triage vital signs and the nursing notes.  Differential diagnosis includes, but is not limited to, bacterial conjunctivitis, viral conjunctivitis, facial cellulitis, abscess, sepsis, open globe  Generally healthy 27 year old presents with evidence of bacterial gingivitis and facial cellulitis suitable for outpatient management with antibiotics.  We will start him on ophthalmic antibiotic for the conjunctivitis as well systemic antibiotic for the facial cellulitis.  We discussed management at home and return precautions.  Patient suitable for outpatient management.     FINAL CLINICAL IMPRESSION(S) / ED DIAGNOSES   Final diagnoses:  None     Rx / DC Orders   ED Discharge Orders     None        Note:  This document was prepared using Dragon voice recognition software and may include unintentional dictation errors.   Delton Prairie, MD  08/25/22 0225  

## 2022-08-25 NOTE — Discharge Instructions (Addendum)
Use over-the-counter hydrating eyedrops to help with the irritation  I would use antibiotic eyedrops for the next few days  Try your best not to rub the eye  Continue warm compresses  Keflex antibiotics 3 times daily for the next 5 days, finish all 15 pills, to treat superficial skin infection of the face  Please take Tylenol and ibuprofen/Advil for your pain.  It is safe to take them together, or to alternate them every few hours.  Take up to 1000mg  of Tylenol at a time, up to 4 times per day.  Do not take more than 4000 mg of Tylenol in 24 hours.  For ibuprofen, take 400-600 mg, 3 - 4 times per day.

## 2023-01-14 DIAGNOSIS — Z03818 Encounter for observation for suspected exposure to other biological agents ruled out: Secondary | ICD-10-CM | POA: Diagnosis not present

## 2023-01-14 DIAGNOSIS — J039 Acute tonsillitis, unspecified: Secondary | ICD-10-CM | POA: Diagnosis not present

## 2023-01-14 DIAGNOSIS — Z20818 Contact with and (suspected) exposure to other bacterial communicable diseases: Secondary | ICD-10-CM | POA: Diagnosis not present

## 2023-04-20 ENCOUNTER — Other Ambulatory Visit: Payer: Self-pay

## 2023-04-20 ENCOUNTER — Emergency Department
Admission: EM | Admit: 2023-04-20 | Discharge: 2023-04-20 | Disposition: A | Discharge status: Home / Self Care | Payer: Medicaid Other | Attending: Emergency Medicine | Admitting: Emergency Medicine

## 2023-04-20 DIAGNOSIS — M542 Cervicalgia: Secondary | ICD-10-CM | POA: Insufficient documentation

## 2023-04-20 NOTE — ED Triage Notes (Signed)
 Pt c/o HA since Friday with minimal relief using motrin. Pt has area behind right ear that is tender to palpation. Pt denies fevers or any tinnitus. Pt denies any exacerbating factors. Pt denies otalgia.

## 2023-04-20 NOTE — ED Provider Notes (Signed)
   [facility] Provider Note    Event Date/Time   First MD Initiated Contact with Patient 04/20/23 1809     (approximate)   History   Headache   HPI  Kent Torres is a 28 y.o. male no significant past medical history who comes ED complaining of pain behind the right ear that started 2 days ago gradually, improved with ibuprofen, but persistent.  Denies hearing change or headache, no vision change, no fever or chills or difficulty with swallowing or breathing.  No trauma.  Does report that he had a viral illness last week.     Physical Exam   Triage Vital Signs: ED Triage Vitals  Encounter Vitals Group     BP 04/20/23 1607 (!) 166/104     Systolic BP Percentile --      Diastolic BP Percentile --      Pulse Rate 04/20/23 1607 81     Resp 04/20/23 1607 16     Temp 04/20/23 1607 98.3 F (36.8 C)     Temp Source 04/20/23 1607 Oral     SpO2 04/20/23 1607 100 %     Weight --      Height --      Head Circumference --      Peak Flow --      Pain Score 04/20/23 1605 8     Pain Loc --      Pain Education --      Exclude from Growth Chart --     Most recent vital signs: Vitals:   04/20/23 1607  BP: (!) 166/104  Pulse: 81  Resp: 16  Temp: 98.3 F (36.8 C)  SpO2: 100%    General: Awake, no distress.  CV:  Good peripheral perfusion.  Regular rate rhythm Resp:  Normal effort.  Clear to auscultation Abd:  No distention.  Other:  No palpable lymphadenopathy or fluid collection.  There is some mild tenderness in the right neck posterior to the ear.  Auricles noninflamed.  External canal and TM are normal.  No oral lesions or swelling, moist oral mucosa.  No mastoid tenderness to percussion   ED Results / Procedures / Treatments   Labs (all labs ordered are listed, but only abnormal results are displayed) Labs Reviewed - No data to display   RADIOLOGY    PROCEDURES:  Procedures   MEDICATIONS ORDERED IN ED: Medications - No data to  display   IMPRESSION / MDM / ASSESSMENT AND PLAN / ED COURSE  I reviewed the triage vital signs and the nursing notes.                              Differential diagnosis includes, but is not limited to, muscle strain, reactive lymphadenopathy.  Doubt meningitis mastoiditis.  Continue NSAIDs.       FINAL CLINICAL IMPRESSION(S) / ED DIAGNOSES   Final diagnoses:  Neck pain on right side     Rx / DC Orders   ED Discharge Orders     None        Note:  This document was prepared using Dragon voice recognition software and may include unintentional dictation errors.   Viviann Pastor, MD 04/20/23 [phone]

## 2023-04-20 NOTE — ED Provider Triage Note (Signed)
 Emergency Medicine Provider Triage Evaluation Note  Januel Doolan , a 28 y.o. male  was evaluated in triage.  Pt complains of Headache with tenderness over the right mastoid process x 2 days. Denies fever or recent illness. Denies ear pain. No visual loss  Review of Systems  Positive:  Negative:   Physical Exam  There were no vitals taken for this visit. Gen:   Awake, no distress   Resp:  Normal effort  MSK:   Moves extremities without difficulty  Other:  Mild tenderness over right mastoid region. No erythema or edema.   Medical Decision Making  Medically screening exam initiated at 4:06 PM.  Appropriate orders placed.  Rainey Kahrs was informed that the remainder of the evaluation will be completed by another provider, this initial triage assessment does not replace that evaluation, and the importance of remaining in the ED until their evaluation is complete.    Margrette, Danh Bayus A, PA-C 04/20/23 [phone]

## 2023-04-21 DIAGNOSIS — R590 Localized enlarged lymph nodes: Secondary | ICD-10-CM | POA: Diagnosis not present

## 2023-04-21 DIAGNOSIS — H9201 Otalgia, right ear: Secondary | ICD-10-CM | POA: Diagnosis not present

## 2023-04-21 DIAGNOSIS — K118 Other diseases of salivary glands: Secondary | ICD-10-CM | POA: Diagnosis not present

## 2023-10-16 DIAGNOSIS — R079 Chest pain, unspecified: Secondary | ICD-10-CM | POA: Diagnosis not present

## 2023-10-16 DIAGNOSIS — R0789 Other chest pain: Secondary | ICD-10-CM | POA: Diagnosis not present

## 2023-10-28 DIAGNOSIS — R0789 Other chest pain: Secondary | ICD-10-CM | POA: Diagnosis not present

## 2023-10-28 DIAGNOSIS — J45909 Unspecified asthma, uncomplicated: Secondary | ICD-10-CM | POA: Diagnosis not present
